# Patient Record
Sex: Female | Born: 1975 | Race: White | Hispanic: No | Marital: Married | Smoking: Current every day smoker
Health system: Southern US, Community
[De-identification: ages and names within clinical notes are randomized; demographics above are authoritative.]

## PROBLEM LIST (undated history)

## (undated) DIAGNOSIS — I82409 Acute embolism and thrombosis of unspecified deep veins of unspecified lower extremity: Secondary | ICD-10-CM

## (undated) DIAGNOSIS — F419 Anxiety disorder, unspecified: Secondary | ICD-10-CM

## (undated) DIAGNOSIS — F329 Major depressive disorder, single episode, unspecified: Secondary | ICD-10-CM

## (undated) DIAGNOSIS — F32A Depression, unspecified: Secondary | ICD-10-CM

## (undated) HISTORY — PX: MOUTH SURGERY: SHX715

## (undated) HISTORY — PX: BACK SURGERY: SHX140

---

## 2013-10-06 ENCOUNTER — Encounter (HOSPITAL_COMMUNITY): Payer: Self-pay | Admitting: *Deleted

## 2013-10-06 ENCOUNTER — Inpatient Hospital Stay (HOSPITAL_COMMUNITY)
Admission: AD | Admit: 2013-10-06 | Discharge: 2013-10-06 | Disposition: A | Discharge status: Home / Self Care | Payer: Self-pay | Source: Ambulatory Visit | Attending: Obstetrics and Gynecology | Admitting: Obstetrics and Gynecology

## 2013-10-06 ENCOUNTER — Inpatient Hospital Stay (HOSPITAL_COMMUNITY): Payer: Self-pay

## 2013-10-06 DIAGNOSIS — F3289 Other specified depressive episodes: Secondary | ICD-10-CM | POA: Insufficient documentation

## 2013-10-06 DIAGNOSIS — F411 Generalized anxiety disorder: Secondary | ICD-10-CM | POA: Insufficient documentation

## 2013-10-06 DIAGNOSIS — F17200 Nicotine dependence, unspecified, uncomplicated: Secondary | ICD-10-CM | POA: Insufficient documentation

## 2013-10-06 DIAGNOSIS — F329 Major depressive disorder, single episode, unspecified: Secondary | ICD-10-CM | POA: Insufficient documentation

## 2013-10-06 DIAGNOSIS — N920 Excessive and frequent menstruation with regular cycle: Secondary | ICD-10-CM | POA: Insufficient documentation

## 2013-10-06 DIAGNOSIS — N390 Urinary tract infection, site not specified: Secondary | ICD-10-CM | POA: Insufficient documentation

## 2013-10-06 DIAGNOSIS — N83209 Unspecified ovarian cyst, unspecified side: Secondary | ICD-10-CM | POA: Insufficient documentation

## 2013-10-06 HISTORY — DX: Depression, unspecified: F32.A

## 2013-10-06 HISTORY — DX: Major depressive disorder, single episode, unspecified: F32.9

## 2013-10-06 HISTORY — DX: Anxiety disorder, unspecified: F41.9

## 2013-10-06 HISTORY — DX: Acute embolism and thrombosis of unspecified deep veins of unspecified lower extremity: I82.409

## 2013-10-06 LAB — WET PREP, GENITAL
Clue Cells Wet Prep HPF POC: NONE SEEN
Trich, Wet Prep: NONE SEEN
Yeast Wet Prep HPF POC: NONE SEEN

## 2013-10-06 LAB — CBC
HEMATOCRIT: 34.2 % — AB (ref 36.0–46.0)
HEMOGLOBIN: 11.8 g/dL — AB (ref 12.0–15.0)
MCH: 31.4 pg (ref 26.0–34.0)
MCHC: 34.5 g/dL (ref 30.0–36.0)
MCV: 91 fL (ref 78.0–100.0)
Platelets: 234 10*3/uL (ref 150–400)
RBC: 3.76 MIL/uL — AB (ref 3.87–5.11)
RDW: 13.2 % (ref 11.5–15.5)
WBC: 8.6 10*3/uL (ref 4.0–10.5)

## 2013-10-06 LAB — URINALYSIS, ROUTINE W REFLEX MICROSCOPIC
Bilirubin Urine: NEGATIVE
Glucose, UA: NEGATIVE mg/dL
Ketones, ur: NEGATIVE mg/dL
Leukocytes, UA: NEGATIVE
NITRITE: POSITIVE — AB
PROTEIN: NEGATIVE mg/dL
Specific Gravity, Urine: 1.025 (ref 1.005–1.030)
UROBILINOGEN UA: 0.2 mg/dL (ref 0.0–1.0)
pH: 5.5 (ref 5.0–8.0)

## 2013-10-06 LAB — URINE MICROSCOPIC-ADD ON

## 2013-10-06 LAB — POCT PREGNANCY, URINE: PREG TEST UR: NEGATIVE

## 2013-10-06 MED ORDER — MEDROXYPROGESTERONE ACETATE 10 MG PO TABS: 10.0000 mg | ORAL_TABLET | Freq: Every day | ORAL | Status: AC

## 2013-10-06 MED ORDER — CIPROFLOXACIN HCL 500 MG PO TABS: 500.0000 mg | ORAL_TABLET | Freq: Two times a day (BID) | ORAL | Status: AC

## 2013-10-06 NOTE — MAU Note (Signed)
Started beginning of April, thought it was her period, but has been heavier.  Has bled every day since, has gotten heavier.Last Thurs passed several large clots, some had tissue like appearance- what she thought could've been a baby.  Thought the bleeding would stop, but she has continued to bleed.had neg preg test in march before bleeding started.  Also wants something checked with exam: for past 2 yrs has noted a changed in the "tissue (on mons), is almost spongy now"

## 2013-10-06 NOTE — MAU Provider Note (Signed)
History     CSN: [id number]  Arrival date and time: 10/06/13 1758   First Provider Initiated Contact with Patient 10/06/13 1846      Chief Complaint  Patient presents with  . Vaginal Bleeding   HPI Comments: Kirt BoysSara Oloughlin 38 y.o. G0P0 presents with vaginal bleeding since April 1st. LMP previously was the last week of February. Had negative pregnancy test right before bleeding started-is trying to become pregnant. Bleeding has remained constant since onset. Changing super tampons q3-4 hours. Last Thursday, noticed large clots as well as "organ tissue" and believed she had had a miscarriage. Feels tired and weaker over past 6 weeks. Has not seen an MD in several years due to cost. Denies abdominal pain, nausea, vomiting. Has had no pap in last 4-5 years and does not recall an abnormal ever.   Vaginal Bleeding    Past Medical History  Diagnosis Date  . DVT (deep venous thrombosis)   . Depression   . Anxiety     Past Surgical History  Procedure Laterality Date  . Mouth surgery    . Back surgery      Family History  Problem Relation Age of Onset  . Ovarian cysts Sister   . Fibroids Sister   . Heart disease Maternal Grandmother   . Heart disease Maternal Grandfather   . Heart disease Paternal Grandmother   . Heart disease Paternal Grandfather     History  Substance Use Topics  . Smoking status: Current Every Day Smoker    Types: E-cigarettes  . Smokeless tobacco: Not on file  . Alcohol Use: No    Allergies: No Known Allergies  Prescriptions prior to admission  Medication Sig Dispense Refill  . acetaminophen (TYLENOL) 325 MG tablet Take 650 mg by mouth every 6 (six) hours as needed for moderate pain.      . Alpha-Lipoic Acid (LIPOIC ACID PO) Take 1 tablet by mouth daily.      . B Complex Vitamins (B COMPLEX PO) Take 1 tablet by mouth daily.      . Chlorpheniramine-Phenylephrine (SUDAFED PE SINUS/ALLERGY PO) Take 2 tablets by mouth as needed (sinus pain).      .  Coenzyme Q10 (CO Q 10 PO) Take 1 tablet by mouth daily.      . diphenhydrAMINE (BENADRYL) 25 MG tablet Take 25 mg by mouth every 6 (six) hours as needed for allergies.      . Flaxseed, Linseed, (FLAXSEED OIL PO) Take 1 capsule by mouth daily.      . GuaiFENesin (MUCINEX PO) Take 1 tablet by mouth as needed (chest congestion).      Marland Kitchen. ibuprofen (ADVIL,MOTRIN) 200 MG tablet Take 400 mg by mouth every 6 (six) hours as needed (swelling, inflammation).      . Misc Natural Products (APPLE CIDER VINEGAR) TABS Take 3 tablets by mouth daily.      . Multiple Vitamins-Minerals (MULTIVITAMIN PO) Take 1 tablet by mouth daily.      . Phenazopyridine HCl (AZO TABS PO) Take 1 tablet by mouth as needed (urinary issues).      . Polyvinyl Alcohol-Povidone (MURINE TEARS FOR DRY EYES OP) Apply 3 drops to eye daily.      . Probiotic Product (PROBIOTIC PO) Take 1 capsule by mouth daily.        Review of Systems  Eyes: Negative.   Respiratory: Negative.  Negative for shortness of breath.   Cardiovascular: Negative.  Negative for palpitations.  Gastrointestinal: Negative.   Genitourinary: Negative.  Musculoskeletal: Negative.   Neurological: Positive for weakness.  Psychiatric/Behavioral: Negative.    Physical Exam   Blood pressure 133/75, pulse 92, temperature 98.4 F (36.9 C), temperature source Oral, resp. rate 20, height 5' 8.5" (1.74 m), weight 109.317 kg (241 lb), last menstrual period 08/18/2013.  Physical Exam  Constitutional: She appears well-developed and well-nourished. No distress.  HENT:  Head: Normocephalic and atraumatic.  Eyes: Pupils are equal, round, and reactive to light.  Cardiovascular: Normal rate, regular rhythm and normal heart sounds.   Respiratory: Effort normal and breath sounds normal.  GI: Soft. Bowel sounds are normal. There is no tenderness. There is no rebound and no guarding.  Genitourinary:  Genital: External genitalia negative Vaginal: Minimal blood present Cervix:  Closed, Wet prep and GC/Chlamydia obtained Bimanual: No CMT, no adnexal masses palpated    Musculoskeletal: Normal range of motion.  Neurological: She is alert.  Skin: Skin is warm and dry.  Abrasions on lower extremities   Psychiatric: She has a normal mood and affect. Her behavior is normal. Judgment and thought content normal.   Results for orders placed during the hospital encounter of 10/06/13 (from the past 24 hour(s))  URINALYSIS, ROUTINE W REFLEX MICROSCOPIC     Status: Abnormal   Collection Time    10/06/13  6:20 PM      Result Value Ref Range   Color, Urine YELLOW  YELLOW   APPearance CLEAR  CLEAR   Specific Gravity, Urine 1.025  1.005 - 1.030   pH 5.5  5.0 - 8.0   Glucose, UA NEGATIVE  NEGATIVE mg/dL   Hgb urine dipstick MODERATE (*) NEGATIVE   Bilirubin Urine NEGATIVE  NEGATIVE   Ketones, ur NEGATIVE  NEGATIVE mg/dL   Protein, ur NEGATIVE  NEGATIVE mg/dL   Urobilinogen, UA 0.2  0.0 - 1.0 mg/dL   Nitrite POSITIVE (*) NEGATIVE   Leukocytes, UA NEGATIVE  NEGATIVE  URINE MICROSCOPIC-ADD ON     Status: Abnormal   Collection Time    10/06/13  6:20 PM      Result Value Ref Range   Squamous Epithelial / LPF FEW (*) RARE   WBC, UA 0-2  <3 WBC/hpf   RBC / HPF 0-2  <3 RBC/hpf   Bacteria, UA FEW (*) RARE  POCT PREGNANCY, URINE     Status: None   Collection Time    10/06/13  6:59 PM      Result Value Ref Range   Preg Test, Ur NEGATIVE  NEGATIVE  CBC     Status: Abnormal   Collection Time    10/06/13  7:30 PM      Result Value Ref Range   WBC 8.6  4.0 - 10.5 K/uL   RBC 3.76 (*) 3.87 - 5.11 MIL/uL   Hemoglobin 11.8 (*) 12.0 - 15.0 g/dL   HCT 16.134.2 (*) 09.636.0 - 04.546.0 %   MCV 91.0  78.0 - 100.0 fL   MCH 31.4  26.0 - 34.0 pg   MCHC 34.5  30.0 - 36.0 g/dL   RDW 40.913.2  81.111.5 - 91.415.5 %   Platelets 234  150 - 400 K/uL    MAU Course  Procedures  MDM UA, Pregnancy test, CBC, US Pelvis, urine culture  Assessment and Plan  A: DUB   P: Above orders    Delbert PhenixLinda M  Sonita Michiels 10/06/2013, 7:36 PM

## 2013-10-06 NOTE — MAU Provider Note (Signed)
History    CSN: [id number]  Arrival date and time: 10/06/13 1758  First Provider Initiated Contact with Patient 10/06/13 1846  Chief Complaint   Patient presents with   .  Vaginal Bleeding    HPI Comments: Caitlin Mckay 38 y.o. G0P0 presents with vaginal bleeding since April 1st. LMP previously was the last week of February. Had negative pregnancy test right before bleeding started-is trying to become pregnant. Bleeding has remained constant since onset. Changing super tampons q3-4 hours. Last Thursday, noticed large clots as well as "organ tissue" and believed she had had a miscarriage. Feels tired and weaker over past 6 weeks. Has not seen an MD in several years due to cost. Denies abdominal pain, nausea, vomiting. Has had no pap in last 4-5 years and does not recall an abnormal ever.  Vaginal Bleeding   Past Medical History   Diagnosis  Date   .  DVT (deep venous thrombosis)    .  Depression    .  Anxiety     Past Surgical History   Procedure  Laterality  Date   .  Mouth surgery     .  Back surgery      Family History   Problem  Relation  Age of Onset   .  Ovarian cysts  Sister    .  Fibroids  Sister    .  Heart disease  Maternal Grandmother    .  Heart disease  Maternal Grandfather    .  Heart disease  Paternal Grandmother    .  Heart disease  Paternal Grandfather     History   Substance Use Topics   .  Smoking status:  Current Every Day Smoker     Types:  E-cigarettes   .  Smokeless tobacco:  Not on file   .  Alcohol Use:  No    Allergies: No Known Allergies  Prescriptions prior to admission   Medication  Sig  Dispense  Refill   .  acetaminophen (TYLENOL) 325 MG tablet  Take 650 mg by mouth every 6 (six) hours as needed for moderate pain.     .  Alpha-Lipoic Acid (LIPOIC ACID PO)  Take 1 tablet by mouth daily.     .  B Complex Vitamins (B COMPLEX PO)  Take 1 tablet by mouth daily.     .  Chlorpheniramine-Phenylephrine (SUDAFED PE SINUS/ALLERGY PO)  Take 2 tablets by  mouth as needed (sinus pain).     .  Coenzyme Q10 (CO Q 10 PO)  Take 1 tablet by mouth daily.     .  diphenhydrAMINE (BENADRYL) 25 MG tablet  Take 25 mg by mouth every 6 (six) hours as needed for allergies.     .  Flaxseed, Linseed, (FLAXSEED OIL PO)  Take 1 capsule by mouth daily.     .  GuaiFENesin (MUCINEX PO)  Take 1 tablet by mouth as needed (chest congestion).     Marland Kitchen  ibuprofen (ADVIL,MOTRIN) 200 MG tablet  Take 400 mg by mouth every 6 (six) hours as needed (swelling, inflammation).     .  Misc Natural Products (APPLE CIDER VINEGAR) TABS  Take 3 tablets by mouth daily.     .  Multiple Vitamins-Minerals (MULTIVITAMIN PO)  Take 1 tablet by mouth daily.     .  Phenazopyridine HCl (AZO TABS PO)  Take 1 tablet by mouth as needed (urinary issues).     .  Polyvinyl Alcohol-Povidone (MURINE TEARS FOR DRY EYES  OP)  Apply 3 drops to eye daily.     .  Probiotic Product (PROBIOTIC PO)  Take 1 capsule by mouth daily.      Review of Systems  Eyes: Negative.  Respiratory: Negative. Negative for shortness of breath.  Cardiovascular: Negative. Negative for palpitations.  Gastrointestinal: Negative.  Genitourinary: Negative.  Musculoskeletal: Negative.  Neurological: Positive for weakness.  Psychiatric/Behavioral: Negative.   Physical Exam   Blood pressure 133/75, pulse 92, temperature 98.4 F (36.9 C), temperature source Oral, resp. rate 20, height 5' 8.5" (1.74 m), weight 109.317 kg (241 lb), last menstrual period 08/18/2013.  Physical Exam  Constitutional: She appears well-developed and well-nourished. No distress.  HENT:  Head: Normocephalic and atraumatic.  Eyes: Pupils are equal, round, and reactive to light.  Cardiovascular: Normal rate, regular rhythm and normal heart sounds.  Respiratory: Effort normal and breath sounds normal.  GI: Soft. Bowel sounds are normal. There is no tenderness. There is no rebound and no guarding.  Genitourinary:  Genital: External genitalia negative Vaginal:  Minimal blood present Cervix: Closed, Wet prep and GC/Chlamydia obtained Bimanual: No CMT, no adnexal masses palpated   Musculoskeletal: Normal range of motion.  Neurological: She is alert.  Skin: Skin is warm and dry.  Abrasions on lower extremities  Psychiatric: She has a normal mood and affect. Her behavior is normal. Judgment and thought content normal.   Results for orders placed during the hospital encounter of 10/06/13 (from the past 24 hour(s))   URINALYSIS, ROUTINE W REFLEX MICROSCOPIC Status: Abnormal    Collection Time    10/06/13 6:20 PM   Result  Value  Ref Range    Color, Urine  YELLOW  YELLOW    APPearance  CLEAR  CLEAR    Specific Gravity, Urine  1.025  1.005 - 1.030    pH  5.5  5.0 - 8.0    Glucose, UA  NEGATIVE  NEGATIVE mg/dL    Hgb urine dipstick  MODERATE (*)  NEGATIVE    Bilirubin Urine  NEGATIVE  NEGATIVE    Ketones, ur  NEGATIVE  NEGATIVE mg/dL    Protein, ur  NEGATIVE  NEGATIVE mg/dL    Urobilinogen, UA  0.2  0.0 - 1.0 mg/dL    Nitrite  POSITIVE (*)  NEGATIVE    Leukocytes, UA  NEGATIVE  NEGATIVE   URINE MICROSCOPIC-ADD ON Status: Abnormal    Collection Time    10/06/13 6:20 PM   Result  Value  Ref Range    Squamous Epithelial / LPF  FEW (*)  RARE    WBC, UA  0-2  <3 WBC/hpf    RBC / HPF  0-2  <3 RBC/hpf    Bacteria, UA  FEW (*)  RARE   POCT PREGNANCY, URINE Status: None    Collection Time    10/06/13 6:59 PM   Result  Value  Ref Range    Preg Test, Ur  NEGATIVE  NEGATIVE   CBC Status: Abnormal    Collection Time    10/06/13 7:30 PM   Result  Value  Ref Range    WBC  8.6  4.0 - 10.5 K/uL    RBC  3.76 (*)  3.87 - 5.11 MIL/uL    Hemoglobin  11.8 (*)  12.0 - 15.0 g/dL    HCT  16.1 (*)  09.6 - 46.0 %    MCV  91.0  78.0 - 100.0 fL    MCH  31.4  26.0 - 34.0 pg  MCHC  34.5  30.0 - 36.0 g/dL    RDW  16.113.2  09.611.5 - 04.515.5 %    Platelets  234  150 - 400 K/uL    MAU Course   Procedures  MDM  UA, Pregnancy test, CBC, US Pelvis, urine culture   Assessment and Plan   A: DUB  P: Above orders  Delbert PhenixLinda M Barefoot  10/06/2013, 7:36 PM   Care of pt assumed by Dorathy KinsmanVirginia Lavender Stanke, CNM at 2000. Awaiting US.  [place] Transvaginal Non-ob  10/06/2013   CLINICAL DATA:  Menometrorrhagia.  EXAM: TRANSABDOMINAL AND TRANSVAGINAL ULTRASOUND OF PELVIS  TECHNIQUE: Both transabdominal and transvaginal ultrasound examinations of the pelvis were performed. Transabdominal technique was performed for global imaging of the pelvis including uterus, ovaries, adnexal regions, and pelvic cul-de-sac. It was necessary to proceed with endovaginal exam following the transabdominal exam to visualize the ovaries.  COMPARISON:  None  FINDINGS: Uterus  Measurements: 8.2 x 4.1 x 5.5 cm. No fibroids or other mass visualized.  Endometrium  Thickness: 1.2 cm. Endometrial stripe is mildly heterogeneous possibly due to bleeding. No discrete mass identified.  Right ovary  Measurements: 6.4 x 4.4 x 3.6 cm. There is a cyst measuring 4.6 x 3.4 x 4.1 cm which demonstrates homogeneous echoes throughout and appears to have a mildly thickened wall. Also seen is a simple cyst measuring 3.1 x 2.0 x 2.8 cm.  Left ovary  Measurements: 7.2 x 5.1 x 5.5 cm. Simple appearing cyst measures 5.0 x 4.5 x 4.8 cm.  IMPRESSION: 4.6 cm cyst in the right ovary has an appearance suggestive of a dermoid or hemorrhagic cyst. The lesion cannot be definitively characterized. Recommend repeat ultrasound in 6 weeks.  Simple bilateral ovarian cysts.  Mildly heterogeneous appearance the endometrial may be secondary to bleeding.   Electronically Signed   By: Drusilla Kannerhomas  Dalessio M.D.   On: 10/06/2013 20:30   [place] Pelvis Complete  10/06/2013   CLINICAL DATA:  Menometrorrhagia.  EXAM: TRANSABDOMINAL AND TRANSVAGINAL ULTRASOUND OF PELVIS  TECHNIQUE: Both transabdominal and transvaginal ultrasound examinations of the pelvis were performed. Transabdominal technique was performed for global imaging of the pelvis including uterus, ovaries,  adnexal regions, and pelvic cul-de-sac. It was necessary to proceed with endovaginal exam following the transabdominal exam to visualize the ovaries.  COMPARISON:  None  FINDINGS: Uterus  Measurements: 8.2 x 4.1 x 5.5 cm. No fibroids or other mass visualized.  Endometrium  Thickness: 1.2 cm. Endometrial stripe is mildly heterogeneous possibly due to bleeding. No discrete mass identified.  Right ovary  Measurements: 6.4 x 4.4 x 3.6 cm. There is a cyst measuring 4.6 x 3.4 x 4.1 cm which demonstrates homogeneous echoes throughout and appears to have a mildly thickened wall. Also seen is a simple cyst measuring 3.1 x 2.0 x 2.8 cm.  Left ovary  Measurements: 7.2 x 5.1 x 5.5 cm. Simple appearing cyst measures 5.0 x 4.5 x 4.8 cm.  IMPRESSION: 4.6 cm cyst in the right ovary has an appearance suggestive of a dermoid or hemorrhagic cyst. The lesion cannot be definitively characterized. Recommend repeat ultrasound in 6 weeks.  Simple bilateral ovarian cysts.  Mildly heterogeneous appearance the endometrial may be secondary to bleeding.   Electronically Signed   By: Drusilla Kannerhomas  Dalessio M.D.   On: 10/06/2013 20:30   ASSESSMENT: 1. Menorrhagia   2. Simple ovarian cyst   3. Hemorrhagic ovarian cyst   4. UTI (urinary tract infection)     PLAN: D.C home in stable condition. Bleeding precautions.  Follow-up Information   Follow up with Ob/Gyn. (for preconception counseling and Pap smear)       [facility] (As needed in emergencies)    Contact information:   [address] [id number] [place] [address] [phone]      [facility] In 6 weeks. (repeat ultrasound)    Specialty:  Radiology   Contact information:   [address] [id number] [place] [id number] [phone]        Medication List         acetaminophen 325 MG tablet  Commonly known as:  TYLENOL  Take 650 mg by  mouth every 6 (six) hours as needed for moderate pain.     Apple Cider Vinegar Tabs  Take 3 tablets by mouth daily.     AZO TABS PO  Take 1 tablet by mouth as needed (urinary issues).     B COMPLEX PO  Take 1 tablet by mouth daily.     ciprofloxacin 500 MG tablet  Commonly known as:  CIPRO  Take 1 tablet (500 mg total) by mouth 2 (two) times daily.     CO Q 10 PO  Take 1 tablet by mouth daily.     diphenhydrAMINE 25 MG tablet  Commonly known as:  BENADRYL  Take 25 mg by mouth every 6 (six) hours as needed for allergies.     FLAXSEED OIL PO  Take 1 capsule by mouth daily.     ibuprofen 200 MG tablet  Commonly known as:  ADVIL,MOTRIN  Take 400 mg by mouth every 6 (six) hours as needed (swelling, inflammation).     LIPOIC ACID PO  Take 1 tablet by mouth daily.     medroxyPROGESTERone 10 MG tablet  Commonly known as:  PROVERA  Take 1 tablet (10 mg total) by mouth daily.     MUCINEX PO  Take 1 tablet by mouth as needed (chest congestion).     MULTIVITAMIN PO  Take 1 tablet by mouth daily.     MURINE TEARS FOR DRY EYES OP  Apply 3 drops to eye daily.     PROBIOTIC PO  Take 1 capsule by mouth daily.     SUDAFED PE SINUS/ALLERGY PO  Take 2 tablets by mouth as needed (sinus pain).        [place] Rock[place]ia Eliabeth Shoff, [place] 10/06/2013 9:45 PM

## 2013-10-06 NOTE — MAU Note (Signed)
C/o vaginal bleeding since April 1st; ?miscarrriage this past Thursday;

## 2013-10-06 NOTE — Discharge Instructions (Signed)
Abnormal Uterine Bleeding Abnormal uterine bleeding can affect women at various stages in life, including teenagers, women in their reproductive years, pregnant women, and women who have reached menopause. Several kinds of uterine bleeding are considered abnormal, including:  Bleeding or spotting between periods.   Bleeding after sexual intercourse.   Bleeding that is heavier or more than normal.   Periods that last longer than usual.  Bleeding after menopause.  Many cases of abnormal uterine bleeding are minor and simple to treat, while others are more serious. Any type of abnormal bleeding should be evaluated by your health care provider. Treatment will depend on the cause of the bleeding. HOME CARE INSTRUCTIONS Monitor your condition for any changes. The following actions may help to alleviate any discomfort you are experiencing:  Avoid the use of tampons and douches as directed by your health care provider.  Change your pads frequently. You should get regular pelvic exams and Pap tests. Keep all follow-up appointments for diagnostic tests as directed by your health care provider.  SEEK MEDICAL CARE IF:   Your bleeding lasts more than 1 week.   You feel dizzy at times.  SEEK IMMEDIATE MEDICAL CARE IF:   You pass out.   You are changing pads every 15 to 30 minutes.   You have abdominal pain.  You have a fever.   You become sweaty or weak.   You are passing large blood clots from the vagina.   You start to feel nauseous and vomit. MAKE SURE YOU:   Understand these instructions.  Will watch your condition.  Will get help right away if you are not doing well or get worse. Document Released: 05/06/2005 Document Revised: 01/06/2013 Document Reviewed: 12/03/2012 [facility] Patient Information 2014 [place], [place].  Polycystic Ovarian Syndrome Polycystic ovarian syndrome (PCOS) is a common hormonal disorder among women of reproductive age. Most women with PCOS  grow many small cysts on their ovaries. PCOS can cause problems with your periods and make it difficult to get pregnant. It can also cause an increased risk of miscarriage with pregnancy. If left untreated, PCOS can lead to serious health problems, such as diabetes and heart disease. CAUSES The cause of PCOS is not fully understood, but genetics may be a factor. SIGNS AND SYMPTOMS   Infrequent or no menstrual periods.   Inability to get pregnant (infertility) because of not ovulating.   Increased growth of hair on the face, chest, stomach, back, thumbs, thighs, or toes.   Acne, oily skin, or dandruff.   Pelvic pain.   Weight gain or obesity, usually carrying extra weight around the waist.   Type 2 diabetes.   High cholesterol.   High blood pressure.   Female-pattern baldness or thinning hair.   Patches of thickened and dark brown or black skin on the neck, arms, breasts, or thighs.   Tiny excess flaps of skin (skin tags) in the armpits or neck area.   Excessive snoring and having breathing stop at times while asleep (sleep apnea).   Deepening of the voice.   Gestational diabetes when pregnant.  DIAGNOSIS  There is no single test to diagnose PCOS.   Your health care provider will:   Take a medical history.   Perform a pelvic exam.   Have ultrasonography done.   Check your female and female hormone levels.   Measure glucose or sugar levels in the blood.   Do other blood tests.   If you are producing too many female hormones, your health care  provider will make sure it is from PCOS. At the physical exam, your health care provider will want to evaluate the areas of increased hair growth. Try to allow natural hair growth for a few days before the visit.   During a pelvic exam, the ovaries may be enlarged or swollen because of the increased number of small cysts. This can be seen more easily by using vaginal ultrasonography or screening to examine  the ovaries and lining of the uterus (endometrium) for cysts. The uterine lining may become thicker if you have not been having a regular period.  TREATMENT  Because there is no cure for PCOS, it needs to be managed to prevent problems. Treatments are based on your symptoms. Treatment is also based on whether you want to have a baby or whether you need contraception.  Treatment may include:   Progesterone hormone to start a menstrual period.   Birth control pills to make you have regular menstrual periods.   Medicines to make you ovulate, if you want to get pregnant.   Medicines to control your insulin.   Medicine to control your blood pressure.   Medicine and diet to control your high cholesterol and triglycerides in your blood.  Medicine to reduce excessive hair growth.  Surgery, making small holes in the ovary, to decrease the amount of female hormone production. This is done through a long, lighted tube (laparoscope) placed into the pelvis through a tiny incision in the lower abdomen.  HOME CARE INSTRUCTIONS  Only take over-the-counter or prescription medicine as directed by your health care provider.  Pay attention to the foods you eat and your activity levels. This can help reduce the effects of PCOS.  Keep your weight under control.  Eat foods that are low in carbohydrate and high in fiber.  Exercise regularly. SEEK MEDICAL CARE IF:  Your symptoms do not get better with medicine.  You have new symptoms. Document Released: 08/30/2004 Document Revised: 02/24/2013 Document Reviewed: 10/22/2012 [facility] Patient Information 2014 [place], [place].  Urinary Tract Infection Urinary tract infections (UTIs) can develop anywhere along your urinary tract. Your urinary tract is your body's drainage system for removing wastes and extra water. Your urinary tract includes two kidneys, two ureters, a bladder, and a urethra. Your kidneys are a pair of bean-shaped organs. Each kidney  is about the size of your fist. They are located below your ribs, one on each side of your spine. CAUSES Infections are caused by microbes, which are microscopic organisms, including fungi, viruses, and bacteria. These organisms are so small that they can only be seen through a microscope. Bacteria are the microbes that most commonly cause UTIs. SYMPTOMS  Symptoms of UTIs may vary by age and gender of the patient and by the location of the infection. Symptoms in young women typically include a frequent and intense urge to urinate and a painful, burning feeling in the bladder or urethra during urination. Older women and men are more likely to be tired, shaky, and weak and have muscle aches and abdominal pain. A fever may mean the infection is in your kidneys. Other symptoms of a kidney infection include pain in your back or sides below the ribs, nausea, and vomiting. DIAGNOSIS To diagnose a UTI, your caregiver will ask you about your symptoms. Your caregiver also will ask to provide a urine sample. The urine sample will be tested for bacteria and white blood cells. White blood cells are made by your body to help fight infection. TREATMENT  Typically, UTIs can be treated with medication. Because most UTIs are caused by a bacterial infection, they usually can be treated with the use of antibiotics. The choice of antibiotic and length of treatment depend on your symptoms and the type of bacteria causing your infection. HOME CARE INSTRUCTIONS  If you were prescribed antibiotics, take them exactly as your caregiver instructs you. Finish the medication even if you feel better after you have only taken some of the medication.  Drink enough water and fluids to keep your urine clear or pale yellow.  Avoid caffeine, tea, and carbonated beverages. They tend to irritate your bladder.  Empty your bladder often. Avoid holding urine for long periods of time.  Empty your bladder before and after sexual  intercourse.  After a bowel movement, women should cleanse from front to back. Use each tissue only once. SEEK MEDICAL CARE IF:   You have back pain.  You develop a fever.  Your symptoms do not begin to resolve within 3 days. SEEK IMMEDIATE MEDICAL CARE IF:   You have severe back pain or lower abdominal pain.  You develop chills.  You have nausea or vomiting.  You have continued burning or discomfort with urination. MAKE SURE YOU:   Understand these instructions.  Will watch your condition.  Will get help right away if you are not doing well or get worse. Document Released: 02/13/2005 Document Revised: 11/05/2011 Document Reviewed: 06/14/2011 [facility] Patient Information 2014 [place], [place].  Ovarian Cyst An ovarian cyst is a fluid-filled sac that forms on an ovary. The ovaries are small organs that produce eggs in women. Various types of cysts can form on the ovaries. Most are not cancerous. Many do not cause problems, and they often go away on their own. Some may cause symptoms and require treatment. Common types of ovarian cysts include:  Functional cysts These cysts may occur every month during the menstrual cycle. This is normal. The cysts usually go away with the next menstrual cycle if the woman does not get pregnant. Usually, there are no symptoms with a functional cyst.  Endometrioma cysts These cysts form from the tissue that lines the uterus. They are also called "chocolate cysts" because they become filled with blood that turns brown. This type of cyst can cause pain in the lower abdomen during intercourse and with your menstrual period.  Cystadenoma cysts This type develops from the cells on the outside of the ovary. These cysts can get very big and cause lower abdomen pain and pain with intercourse. This type of cyst can twist on itself, cut off its blood supply, and cause severe pain. It can also easily rupture and cause a lot of pain.  Dermoid cysts This type of  cyst is sometimes found in both ovaries. These cysts may contain different kinds of body tissue, such as skin, teeth, hair, or cartilage. They usually do not cause symptoms unless they get very big.  Theca lutein cysts These cysts occur when too much of a certain hormone (human chorionic gonadotropin) is produced and overstimulates the ovaries to produce an egg. This is most common after procedures used to assist with the conception of a baby (in vitro fertilization). CAUSES   Fertility drugs can cause a condition in which multiple large cysts are formed on the ovaries. This is called ovarian hyperstimulation syndrome.  A condition called polycystic ovary syndrome can cause hormonal imbalances that can lead to nonfunctional ovarian cysts. SIGNS AND SYMPTOMS  Many ovarian cysts do not cause  symptoms. If symptoms are present, they may include:  Pelvic pain or pressure.  Pain in the lower abdomen.  Pain during sexual intercourse.  Increasing girth (swelling) of the abdomen.  Abnormal menstrual periods.  Increasing pain with menstrual periods.  Stopping having menstrual periods without being pregnant. DIAGNOSIS  These cysts are commonly found during a routine or annual pelvic exam. Tests may be ordered to find out more about the cyst. These tests may include:  Ultrasound.  X-ray of the pelvis.  CT scan.  MRI.  Blood tests. TREATMENT  Many ovarian cysts go away on their own without treatment. Your health care provider may want to check your cyst regularly for 2 3 months to see if it changes. For women in menopause, it is particularly important to monitor a cyst closely because of the higher rate of ovarian cancer in menopausal women. When treatment is needed, it may include any of the following:  A procedure to drain the cyst (aspiration). This may be done using a long needle and ultrasound. It can also be done through a laparoscopic procedure. This involves using a thin, lighted  tube with a tiny camera on the end (laparoscope) inserted through a small incision.  Surgery to remove the whole cyst. This may be done using laparoscopic surgery or an open surgery involving a larger incision in the lower abdomen.  Hormone treatment or birth control pills. These methods are sometimes used to help dissolve a cyst. HOME CARE INSTRUCTIONS   Only take over-the-counter or prescription medicines as directed by your health care provider.  Follow up with your health care provider as directed.  Get regular pelvic exams and Pap tests. SEEK MEDICAL CARE IF:   Your periods are late, irregular, or painful, or they stop.  Your pelvic pain or abdominal pain does not go away.  Your abdomen becomes larger or swollen.  You have pressure on your bladder or trouble emptying your bladder completely.  You have pain during sexual intercourse.  You have feelings of fullness, pressure, or discomfort in your stomach.  You lose weight for no apparent reason.  You feel generally ill.  You become constipated.  You lose your appetite.  You develop acne.  You have an increase in body and facial hair.  You are gaining weight, without changing your exercise and eating habits.  You think you are pregnant. SEEK IMMEDIATE MEDICAL CARE IF:   You have increasing abdominal pain.  You feel sick to your stomach (nauseous), and you throw up (vomit).  You develop a fever that comes on suddenly.  You have abdominal pain during a bowel movement.  Your menstrual periods become heavier than usual. Document Released: 05/06/2005 Document Revised: 02/24/2013 Document Reviewed: 01/11/2013 [facility] Patient Information 2014 [place], [place].

## 2013-10-07 LAB — GC/CHLAMYDIA PROBE AMP
CT Probe RNA: NEGATIVE
GC Probe RNA: NEGATIVE

## 2013-10-08 LAB — URINE CULTURE

## 2013-10-15 ENCOUNTER — Telehealth: Payer: Self-pay | Admitting: General Practice

## 2013-10-15 NOTE — Telephone Encounter (Signed)
Patient's husband called in to front office stating Brylinn still isn't feeling well and thinks the UTI is still there and wants to know if we can refill her antibiotic. Told him that if she isn't feeling well and doesn't think her antibiotic helped she needs to come in for another urine sample and told him our office hours. He verbalized understanding and asked what she could do at home until they could come in because they live an hour and a half away. Told him she should drink lots of water to flush out her system and to avoid sodas, caffeine and high salt foods. He verbalized understanding and had no further questions

## 2016-01-07 IMAGING — US US TRANSVAGINAL NON-OB
1 series · 13 of 25 positions shown · non-contrast
Comparison: None

CLINICAL DATA: Menometrorrhagia.



[Series 1: us pelvis complete · 13 of 74 slices shown]
[im 1/74]
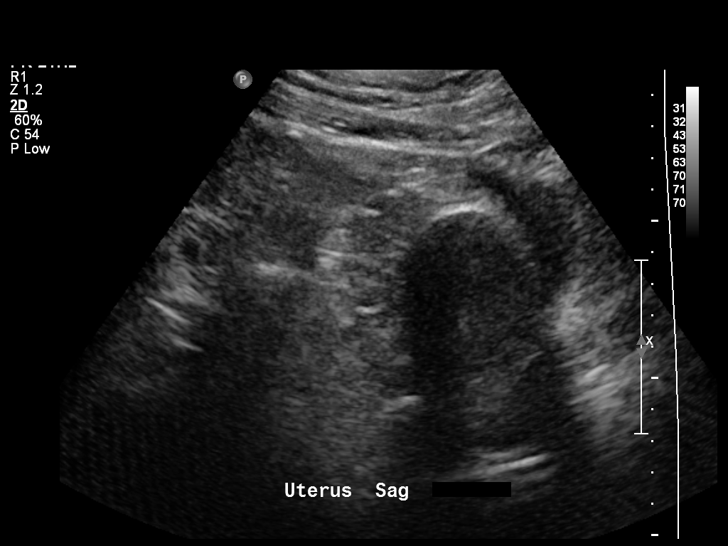
[im 7/74]
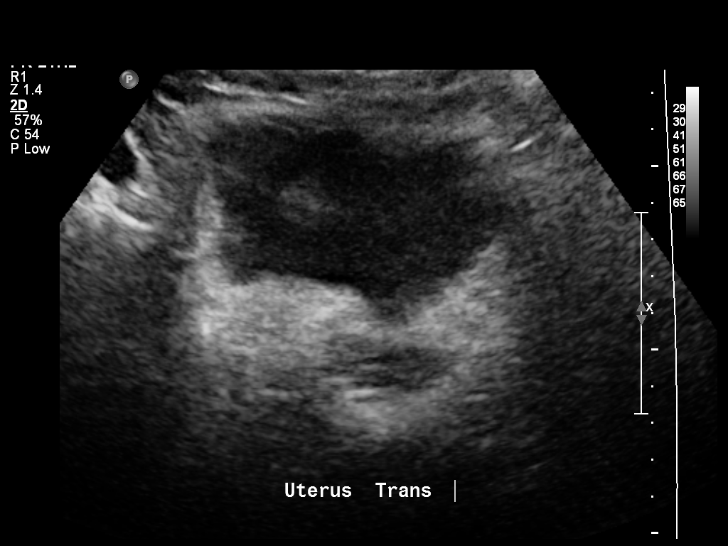
[im 13/74]
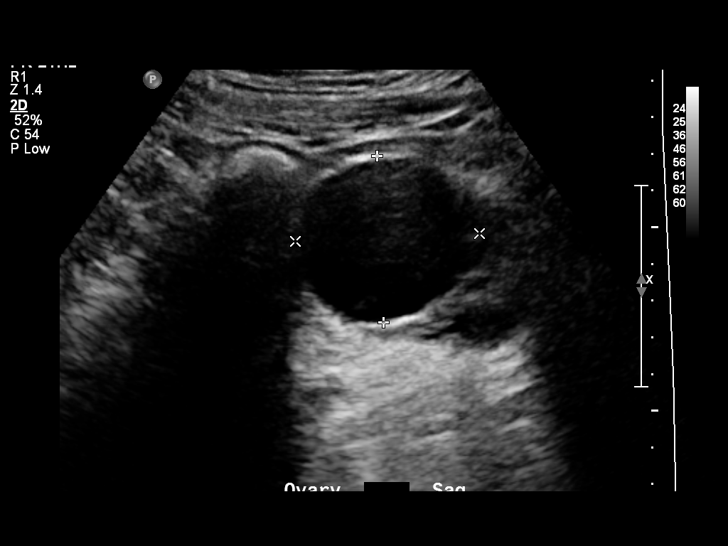
[im 19/74]
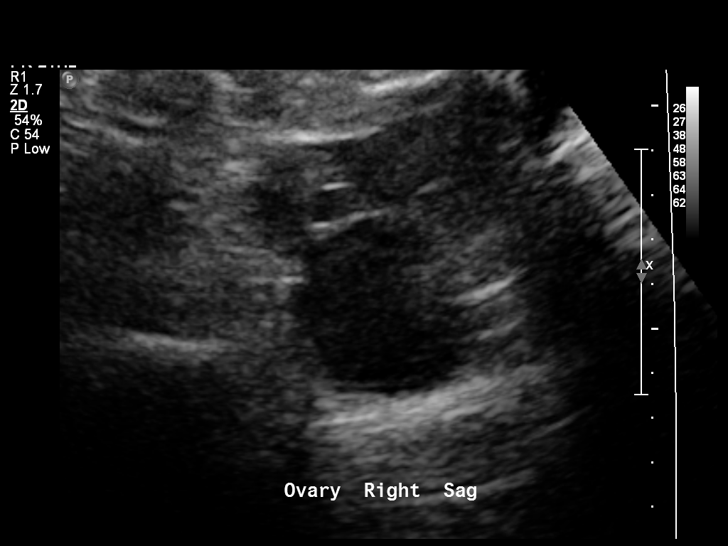
[im 25/74]
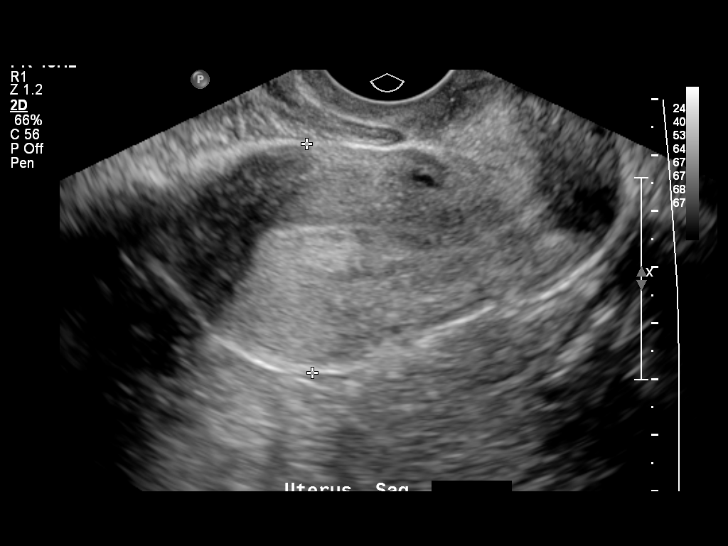
[im 31/74]
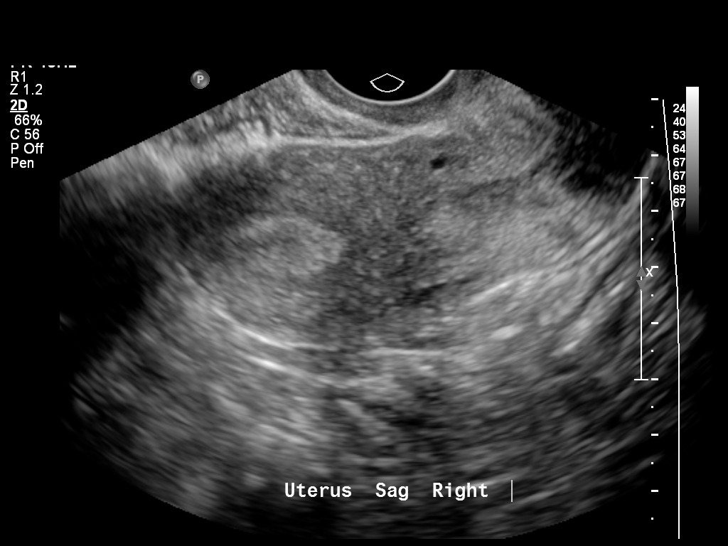
[im 37/74]
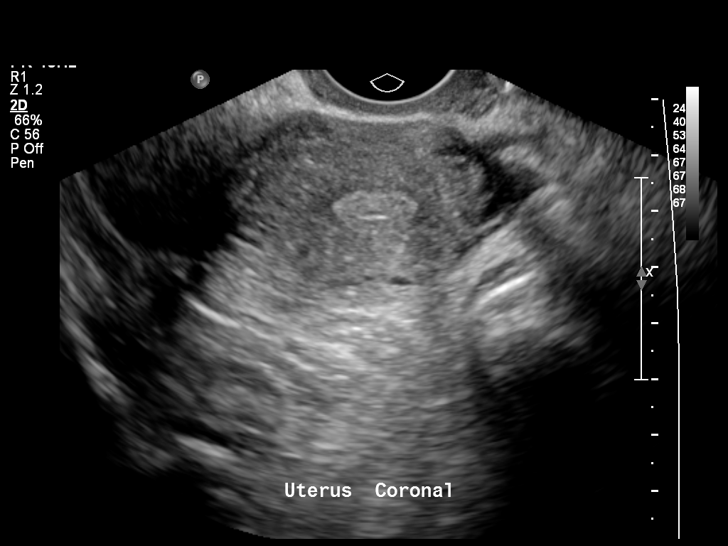
[im 43/74]
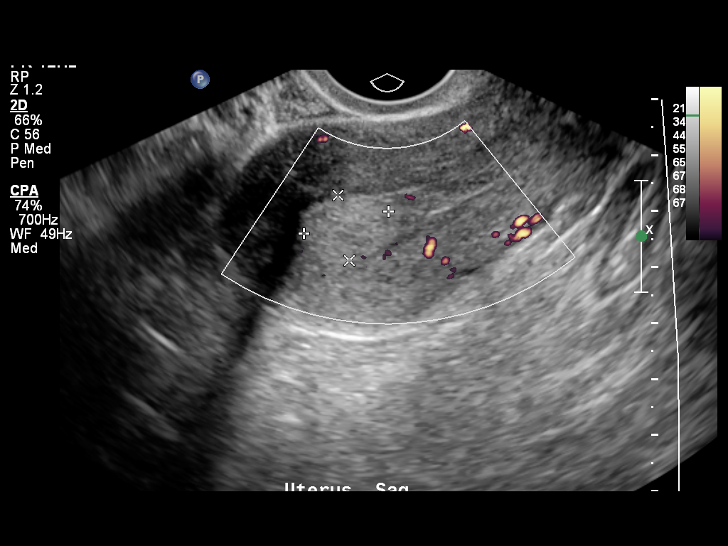
[im 49/74]
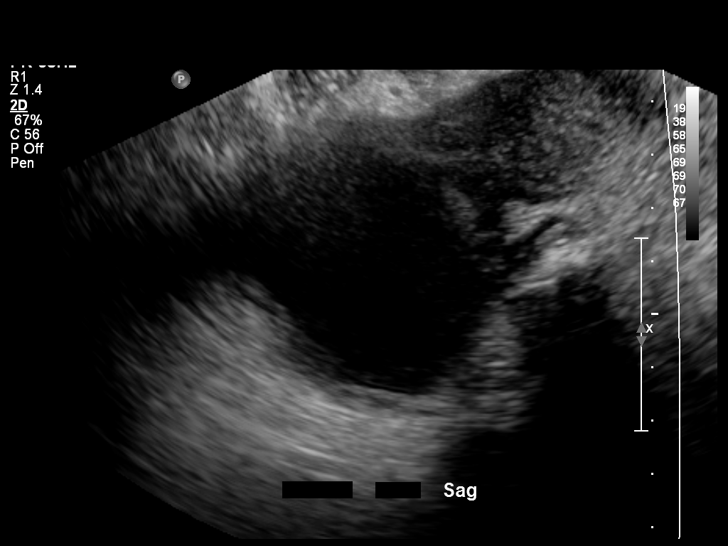
[im 55/74]
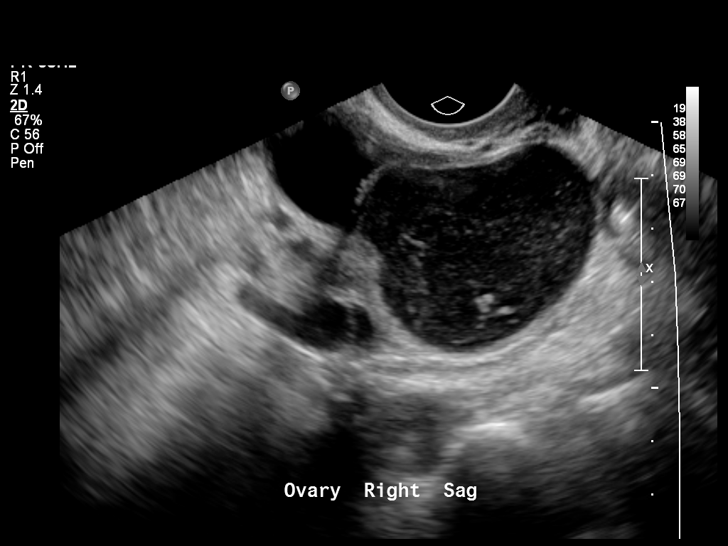
[im 61/74]
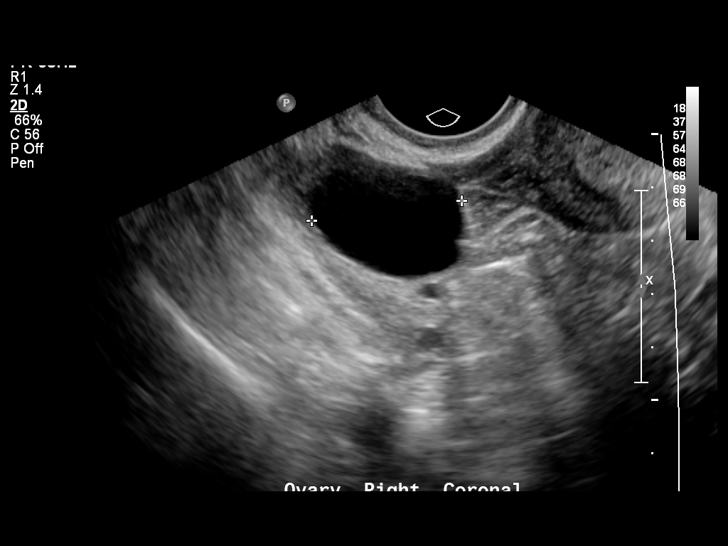
[im 67/74]
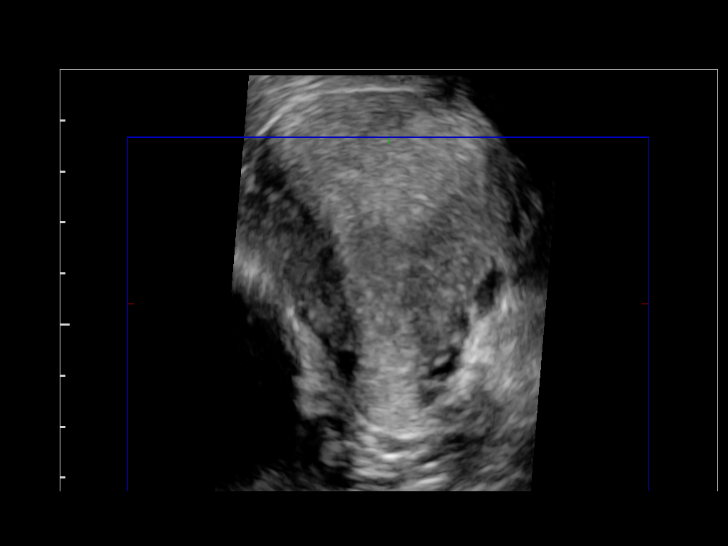
[im 74/74]
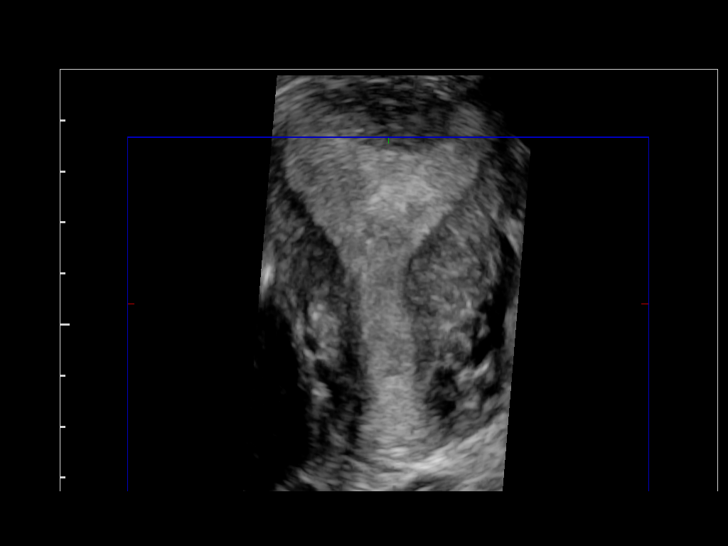

[13 of 25 positions shown; findings below may reference images not displayed]

FINDINGS: Uterus

Measurements: 8.2 x 4.1 x 5.5 cm. No fibroids or other mass
visualized.

Endometrium

Thickness: 1.2 cm. Endometrial stripe is mildly heterogeneous
possibly due to bleeding. No discrete mass identified.

Right ovary

Measurements: 6.4 x 4.4 x 3.6 cm. There is a cyst measuring 4.6 x
3.4 x 4.1 cm which demonstrates homogeneous echoes throughout and
appears to have a mildly thickened wall. Also seen is a simple cyst
measuring 3.1 x 2.0 x 2.8 cm.

Left ovary

Measurements: 7.2 x 5.1 x 5.5 cm. Simple appearing cyst measures
x 4.5 x 4.8 cm.
IMPRESSION: 4.6 cm cyst in the right ovary has an appearance suggestive of a
dermoid or hemorrhagic cyst. The lesion cannot be definitively
characterized. Recommend repeat ultrasound in 6 weeks.

Simple bilateral ovarian cysts.

Mildly heterogeneous appearance the endometrial may be secondary to
bleeding.
